# Patient Record
Sex: Female | Born: 1990 | Race: White | Hispanic: No | Smoking: Never smoker
Health system: Southern US, Community
[De-identification: ages and names within clinical notes are randomized; demographics above are authoritative.]

---

## 2018-07-15 ENCOUNTER — Other Ambulatory Visit: Payer: Self-pay

## 2018-07-15 ENCOUNTER — Ambulatory Visit (HOSPITAL_COMMUNITY)
Admission: EM | Admit: 2018-07-15 | Discharge: 2018-07-15 | Disposition: A | Discharge status: Home / Self Care | Payer: Medicaid Other | Attending: Family Medicine | Admitting: Family Medicine

## 2018-07-15 ENCOUNTER — Encounter (HOSPITAL_COMMUNITY): Payer: Self-pay

## 2018-07-15 DIAGNOSIS — Z3202 Encounter for pregnancy test, result negative: Secondary | ICD-10-CM | POA: Diagnosis not present

## 2018-07-15 DIAGNOSIS — R1032 Left lower quadrant pain: Secondary | ICD-10-CM

## 2018-07-15 DIAGNOSIS — N739 Female pelvic inflammatory disease, unspecified: Secondary | ICD-10-CM

## 2018-07-15 LAB — POCT URINALYSIS DIP (DEVICE)
BILIRUBIN URINE: NEGATIVE
Glucose, UA: NEGATIVE mg/dL
Hgb urine dipstick: NEGATIVE
KETONES UR: NEGATIVE mg/dL
Leukocytes, UA: NEGATIVE
Nitrite: NEGATIVE
PH: 7 (ref 5.0–8.0)
Protein, ur: NEGATIVE mg/dL
SPECIFIC GRAVITY, URINE: 1.02 (ref 1.005–1.030)
Urobilinogen, UA: 0.2 mg/dL (ref 0.0–1.0)

## 2018-07-15 LAB — POCT PREGNANCY, URINE: Preg Test, Ur: NEGATIVE

## 2018-07-15 MED ORDER — DOXYCYCLINE HYCLATE 100 MG PO CAPS: 100.0000 mg | ORAL_CAPSULE | Freq: Two times a day (BID) | ORAL | 0 refills | Status: AC

## 2018-07-15 MED ORDER — METRONIDAZOLE 500 MG PO TABS: 500.0000 mg | ORAL_TABLET | Freq: Two times a day (BID) | ORAL | 0 refills | Status: AC

## 2018-07-15 NOTE — Discharge Instructions (Addendum)
Received ceftriaxone shot at ED 4 days ago Urine did not show signs of infection Urine pregnancy was negative Cervical swab obtained Prescribed metronidazole 500 mg twice daily for 14 days (do not take while consuming alcohol) Prescribed doxycycline 100 mg twice daily for 14 days Take medications as prescribed and to completion We will follow up with you regarding the results of your test If tests are positive, please abstain from sexual activity for at least 7 days and notify partners If symptoms improve and you want to follow up with a GYN regarding possible ovarian cysts please [facility] with [facility] Return here or [facility] if you have any new or worsening symptoms such as worsening pain, vaginal bleeding, no improvement in symptoms within 24 hours, vaginal pain, nausea, vomiting, etc...Marland Kitchen

## 2018-07-15 NOTE — ED Provider Notes (Signed)
[facility]   [id number] 07/15/18 Arrival Time: 1054  CC: ABDOMINAL DISCOMFORT  SUBJECTIVE:  Terriann Difonzo is a 27 y.o. female who presents with complaint of worsening abdominal discomfort that began gradually 2 days.  Denies a precipitating event, or trauma.  Localizes pain to LLQ.  Describes as constant and sharp in character.  Pain is 9/10.  Has not tried OTC medications.  Worse with intercourse.  Reports similar symptoms in the past and diagnosed with cyst on ovary.  Last BM this morning and with straining.  Increased appetite, and constipation.    Denies fever, chills, weight changes, nausea, vomiting, chest pain, SOB, diarrhea,, hematochezia, melena, dysuria, difficulty urinating, increased frequency or urgency, flank pain, loss of bowel or bladder function, vaginal discharge, vaginal odor, vaginal bleeding, vaginal rashes or lesions.    Patient was seen in the ED 4 days ago and treated for acid reflux.  Patient also received STD treatment at that time including a shot and four pills.  States urine did not show signs of infection at that time.  Unsure if urine pregnancy was done at that time.    Patient's last menstrual period was 07/01/2018. Last unprotected sex over 1 month ago.  Currently not on birth control.    ROS: As per HPI.  History reviewed. No pertinent past medical history. History reviewed. No pertinent surgical history. Not on File No current facility-administered medications on file prior to encounter.    No current outpatient medications on file prior to encounter.   Social History   Socioeconomic History  . Marital status: Divorced    Spouse name: Not on file  . Number of children: Not on file  . Years of education: Not on file  . Highest education level: Not on file  Occupational History  . Not on file  Social Needs  . Financial resource strain: Not on file  . Food insecurity:    Worry: Not on file    Inability: Not on file  .  Transportation needs:    Medical: Not on file    Non-medical: Not on file  Tobacco Use  . Smoking status: Never Smoker  . Smokeless tobacco: Never Used  Substance and Sexual Activity  . Alcohol use: Not on file  . Drug use: Not on file  . Sexual activity: Not on file  Lifestyle  . Physical activity:    Days per week: Not on file    Minutes per session: Not on file  . Stress: Not on file  Relationships  . Social connections:    Talks on phone: Not on file    Gets together: Not on file    Attends religious service: Not on file    Active member of club or organization: Not on file    Attends meetings of clubs or organizations: Not on file    Relationship status: Not on file  . Intimate partner violence:    Fear of current or ex partner: Not on file    Emotionally abused: Not on file    Physically abused: Not on file    Forced sexual activity: Not on file  Other Topics Concern  . Not on file  Social History Narrative  . Not on file   History reviewed. No pertinent family history.   OBJECTIVE:  Vitals:   07/15/18 1135  BP: 121/84  Pulse: 73  Resp: 18  Temp: 98.3 F (36.8 C)  TempSrc: Oral  SpO2: 100%  Weight: 170 lb (77.1 kg)  General appearance: AOx3 in no acute distress; appears uncomfortable, but nontoxic HEENT: NCAT.  Oropharynx clear.  Lungs: clear to auscultation bilaterally without adventitious breath sounds Heart: regular rate and rhythm.  Radial pulses 2+ symmetrical bilaterally Abdomen: soft, non-distended; normal active bowel sounds; LLQ tenderness; nontender at McBurney's point; negative Murphy's sign; negative rebound; no guarding GU: Declines chaperone: On external examination no obvious lesions, discharge, or masses Bimanual exam performed prior to speculum exam.  Mild cervical motion and left-sided adenexal tenderness Speculum exam: Thick white/yellow discharge appreciated during pelvic exam from cervix.  Cervix visualized without erythema,  cervical swab obtained Back: no CVA tenderness Extremities: no edema; symmetrical with no gross deformities Skin: warm and dry Neurologic: normal gait Psychological: alert and cooperative; normal mood and affect  Labs: Results for orders placed or performed during the hospital encounter of 07/15/18 (from the past 24 hour(s))  POCT urinalysis dip (device)     Status: None   Collection Time: 07/15/18 12:19 PM  Result Value Ref Range   Glucose, UA NEGATIVE NEGATIVE mg/dL   Bilirubin Urine NEGATIVE NEGATIVE   Ketones, ur NEGATIVE NEGATIVE mg/dL   Specific Gravity, Urine 1.020 1.005 - 1.030   Hgb urine dipstick NEGATIVE NEGATIVE   pH 7.0 5.0 - 8.0   Protein, ur NEGATIVE NEGATIVE mg/dL   Urobilinogen, UA 0.2 0.0 - 1.0 mg/dL   Nitrite NEGATIVE NEGATIVE   Leukocytes, UA NEGATIVE NEGATIVE  Pregnancy, urine POC     Status: None   Collection Time: 07/15/18 12:22 PM  Result Value Ref Range   Preg Test, Ur NEGATIVE NEGATIVE    ASSESSMENT & PLAN:  1. Female pelvic inflammatory disease     Meds ordered this encounter  Medications  . doxycycline (VIBRAMYCIN) 100 MG capsule    Sig: Take 1 capsule (100 mg total) by mouth 2 (two) times daily for 14 days.    Dispense:  28 capsule    Refill:  0    Order Specific Question:   Supervising Provider    Answer:   Isa RankinMURRAY, LAURA WILSON [[id number]]  . metroNIDAZOLE (FLAGYL) 500 MG tablet    Sig: Take 1 tablet (500 mg total) by mouth 2 (two) times daily for 14 days.    Dispense:  28 tablet    Refill:  0    Order Specific Question:   Supervising Provider    Answer:   Isa RankinMURRAY, LAURA WILSON [[id number]]   Received ceftriaxone shot at ED 4 days ago Urine did not show signs of infection Urine pregnancy was negative Cervical swab obtained Prescribed metronidazole 500 mg twice daily for 14 days (do not take while consuming alcohol) Prescribed doxycycline 100 mg twice daily for 14 days Take medications as prescribed and to completion We will follow up with  you regarding the results of your test If tests are positive, please abstain from sexual activity for at least 7 days and notify partners If symptoms improve and you want to follow up with a GYN regarding possible ovarian cysts please follow [facility] with [facility] Return here or [facility] if you have any new or worsening symptoms such as worsening pain, vaginal bleeding, no improvement in symptoms within 24 hours, vaginal pain, nausea, vomiting, etc...  Reviewed expectations re: course of current medical issues. Questions answered. Outlined signs and symptoms indicating need for more acute intervention. Patient verbalized understanding. After Visit Summary given.   Rennis HardingWurst, Uva Runkel, PA-C 07/15/18 1236

## 2018-07-15 NOTE — ED Triage Notes (Signed)
Pt states she has abdominal pain ( lower pelvis left side ) and lower back pain x 3 weeks.

## 2018-07-16 LAB — CERVICOVAGINAL ANCILLARY ONLY
Bacterial vaginitis: POSITIVE — AB
Candida vaginitis: POSITIVE — AB
Chlamydia: NEGATIVE
Neisseria Gonorrhea: NEGATIVE
Trichomonas: NEGATIVE

## 2018-07-17 ENCOUNTER — Emergency Department (HOSPITAL_COMMUNITY)
Admission: EM | Admit: 2018-07-17 | Discharge: 2018-07-17 | Disposition: A | Discharge status: Home / Self Care | Payer: Medicaid Other | Attending: Emergency Medicine | Admitting: Emergency Medicine

## 2018-07-17 ENCOUNTER — Telehealth (HOSPITAL_COMMUNITY): Payer: Self-pay

## 2018-07-17 ENCOUNTER — Encounter (HOSPITAL_COMMUNITY): Payer: Self-pay | Admitting: Emergency Medicine

## 2018-07-17 ENCOUNTER — Other Ambulatory Visit: Payer: Self-pay

## 2018-07-17 DIAGNOSIS — R109 Unspecified abdominal pain: Secondary | ICD-10-CM | POA: Insufficient documentation

## 2018-07-17 DIAGNOSIS — Z5321 Procedure and treatment not carried out due to patient leaving prior to being seen by health care provider: Secondary | ICD-10-CM | POA: Diagnosis not present

## 2018-07-17 DIAGNOSIS — Y939 Activity, unspecified: Secondary | ICD-10-CM | POA: Insufficient documentation

## 2018-07-17 DIAGNOSIS — Y999 Unspecified external cause status: Secondary | ICD-10-CM | POA: Diagnosis not present

## 2018-07-17 DIAGNOSIS — M25511 Pain in right shoulder: Secondary | ICD-10-CM | POA: Diagnosis not present

## 2018-07-17 DIAGNOSIS — M549 Dorsalgia, unspecified: Secondary | ICD-10-CM | POA: Insufficient documentation

## 2018-07-17 DIAGNOSIS — Y92410 Unspecified street and highway as the place of occurrence of the external cause: Secondary | ICD-10-CM | POA: Diagnosis not present

## 2018-07-17 MED ORDER — FLUCONAZOLE 150 MG PO TABS: 150.0000 mg | ORAL_TABLET | Freq: Every day | ORAL | 0 refills | Status: AC
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
## 2018-07-17 NOTE — ED Notes (Signed)
Pt came up to ED ambassador, Benson Norway, and said that she had to go and left after handing over her pt labels.

## 2018-07-17 NOTE — Telephone Encounter (Signed)
Bacterial Vaginosis test is positive.  Prescription for metronidazole was given at the urgent care visit. Pt contacted regarding results.  Candida (yeast) was positive.  Prescription for fluconazole 150mg  po now, repeat dose in 3d if needed, #2 no refills, sent to the pharmacy of record.  Recheck or followup with PCP for further evaluation if symptoms are not improving.   Attempted to reach patient. No answer at this time.

## 2020-10-19 ENCOUNTER — Other Ambulatory Visit: Payer: Self-pay

## 2020-10-19 ENCOUNTER — Inpatient Hospital Stay (HOSPITAL_COMMUNITY)
Admission: AD | Admit: 2020-10-19 | Discharge: 2020-10-19 | Disposition: A | Discharge status: Home / Self Care | Payer: Medicaid Other | Attending: Family Medicine | Admitting: Family Medicine

## 2020-10-19 ENCOUNTER — Encounter (HOSPITAL_COMMUNITY): Payer: Self-pay | Admitting: Family Medicine

## 2020-10-19 ENCOUNTER — Inpatient Hospital Stay (HOSPITAL_COMMUNITY): Payer: Medicaid Other

## 2020-10-19 DIAGNOSIS — Z3A01 Less than 8 weeks gestation of pregnancy: Secondary | ICD-10-CM

## 2020-10-19 DIAGNOSIS — O26891 Other specified pregnancy related conditions, first trimester: Secondary | ICD-10-CM | POA: Diagnosis not present

## 2020-10-19 DIAGNOSIS — R109 Unspecified abdominal pain: Secondary | ICD-10-CM | POA: Diagnosis not present

## 2020-10-19 LAB — URINALYSIS, ROUTINE W REFLEX MICROSCOPIC
Bilirubin Urine: NEGATIVE
Glucose, UA: NEGATIVE mg/dL
Hgb urine dipstick: NEGATIVE
Ketones, ur: NEGATIVE mg/dL
Nitrite: NEGATIVE
Protein, ur: NEGATIVE mg/dL
Specific Gravity, Urine: 1.015 (ref 1.005–1.030)
pH: 5 (ref 5.0–8.0)

## 2020-10-19 LAB — CBC
HCT: 42.3 % (ref 36.0–46.0)
Hemoglobin: 13.9 g/dL (ref 12.0–15.0)
MCH: 29.7 pg (ref 26.0–34.0)
MCHC: 32.9 g/dL (ref 30.0–36.0)
MCV: 90.4 fL (ref 80.0–100.0)
Platelets: 316 10*3/uL (ref 150–400)
RBC: 4.68 MIL/uL (ref 3.87–5.11)
RDW: 13.2 % (ref 11.5–15.5)
WBC: 14.5 10*3/uL — ABNORMAL HIGH (ref 4.0–10.5)
nRBC: 0 % (ref 0.0–0.2)

## 2020-10-19 LAB — HCG, QUANTITATIVE, PREGNANCY: hCG, Beta Chain, Quant, S: 19032 m[IU]/mL — ABNORMAL HIGH (ref <5)

## 2020-10-19 LAB — ABO/RH: ABO/RH(D): A POS

## 2020-10-19 LAB — POCT PREGNANCY, URINE: Preg Test, Ur: POSITIVE — AB

## 2020-10-19 NOTE — MAU Note (Signed)
Rolitta Dawson CNM in Family Rm with pt to discuss test results and d/c plan.

## 2020-10-19 NOTE — MAU Note (Signed)
Having sharp pains in lower abd and lower back, started yesterday.  Found out preg on Christmas, +HPT.  Doesn't think she should be having pain like this - this early. Hx of miscarriage. No bleeding.

## 2020-10-19 NOTE — MAU Provider Note (Signed)
History     CSN: [id number]  Arrival date and time: 10/19/20 1421  Provider in family room to explain results and discuss plan of care at 2000    Chief Complaint  Patient presents with  . Abdominal Pain  . Possible Pregnancy   Ms. Reyes Fifield is a 29 y.o. year old G1P0 female at [redacted]w[redacted]d weeks gestation who presents to MAU reporting sharp abdominal pain, (+) HPT on 10/15/2020, no VB, h/o SAB, and she "doesn't think (she) should be having pain like this this early in pregnancy." She has an OB in [place], [place]; first appointment is scheduled for 11/15/2019.   OB History    Gravida  1   Para      Term      Preterm      AB      Living        SAB      IAB      Ectopic      Multiple      Live Births              History reviewed. No pertinent past medical history.  History reviewed. No pertinent surgical history.  History reviewed. No pertinent family history.  Social History   Tobacco Use  . Smoking status: Never Smoker  . Smokeless tobacco: Never Used    Allergies: Not on File  No medications prior to admission.    Review of Systems  Constitutional: Negative.   HENT: Negative.   Eyes: Negative.   Respiratory: Negative.   Cardiovascular: Negative.   Gastrointestinal: Negative.   Endocrine: Negative.   Genitourinary: Positive for pelvic pain.  Musculoskeletal: Negative.   Skin: Negative.   Allergic/Immunologic: Negative.   Neurological: Negative.   Hematological: Negative.   Psychiatric/Behavioral: Negative.    Physical Exam   Blood pressure 112/65, pulse 74, temperature 98.6 F (37 C), temperature source Oral, resp. rate 18, height 5\' 5"  (1.651 m), weight 83.9 kg, last menstrual period 09/19/2020, SpO2 100 %.  Physical Exam Vitals and nursing note reviewed.  Constitutional:      Appearance: She is well-developed and normal weight.  HENT:     Head: Normocephalic and atraumatic.  Cardiovascular:     Rate and Rhythm: Normal rate.   Skin:    General: Skin is warm and dry.  Neurological:     Mental Status: She is alert.     MAU Course  Procedures  MDM CCUA UPT CBC ABO/Rh HCG OB <14 wks U/S  Results for orders placed or performed during the hospital encounter of 10/19/20 (from the past 24 hour(s))  Urinalysis, Routine w reflex microscopic     Status: Abnormal   Collection Time: 10/19/20  3:34 PM  Result Value Ref Range   Color, Urine YELLOW YELLOW   APPearance HAZY (A) CLEAR   Specific Gravity, Urine 1.015 1.005 - 1.030   pH 5.0 5.0 - 8.0   Glucose, UA NEGATIVE NEGATIVE mg/dL   Hgb urine dipstick NEGATIVE NEGATIVE   Bilirubin Urine NEGATIVE NEGATIVE   Ketones, ur NEGATIVE NEGATIVE mg/dL   Protein, ur NEGATIVE NEGATIVE mg/dL   Nitrite NEGATIVE NEGATIVE   Leukocytes,Ua MODERATE (A) NEGATIVE   RBC / HPF 0-5 0 - 5 RBC/hpf   WBC, UA 0-5 0 - 5 WBC/hpf   Bacteria, UA RARE (A) NONE SEEN   Squamous Epithelial / LPF 11-20 0 - 5   Mucus PRESENT   Pregnancy, urine POC     Status: Abnormal   Collection  Time: 10/19/20  3:38 PM  Result Value Ref Range   Preg Test, Ur POSITIVE (A) NEGATIVE  CBC     Status: Abnormal   Collection Time: 10/19/20  4:26 PM  Result Value Ref Range   WBC 14.5 (H) 4.0 - 10.5 K/uL   RBC 4.68 3.87 - 5.11 MIL/uL   Hemoglobin 13.9 12.0 - 15.0 g/dL   HCT 62.9 52.8 - 41.3 %   MCV 90.4 80.0 - 100.0 fL   MCH 29.7 26.0 - 34.0 pg   MCHC 32.9 30.0 - 36.0 g/dL   RDW 24.4 01.0 - 27.2 %   Platelets 316 150 - 400 K/uL   nRBC 0.0 0.0 - 0.2 %  hCG, quantitative, pregnancy     Status: Abnormal   Collection Time: 10/19/20  4:26 PM  Result Value Ref Range   hCG, Beta Chain, Quant, S 19,032 (H) <5 mIU/mL  ABO/Rh     Status: None   Collection Time: 10/19/20  4:26 PM  Result Value Ref Range   ABO/RH(D) A POS    No rh immune globuloin      NOT A RH IMMUNE GLOBULIN CANDIDATE, PT RH POSITIVE Performed at [facility], [address], [place], [place] [address]     Assessment and Plan   Abdominal pain affecting pregnancy  - Reassurance given that pregnancy is 5.[redacted] wks gestation with (+) FHR, EDC: 06/16/2021 - Information provided on abd pain in preg - Discharge home - Keep scheduled appt with  OB on 11/14/2020 - Patient verbalized an understanding of the plan of care and agrees.   Raelyn Mora, CNM 10/19/2020, 8:18 PM

## 2021-07-05 IMAGING — US US OB < 14 WEEKS - US OB TV
1 series · 15 of 28 positions shown · non-contrast
Comparison: None.

CLINICAL DATA: Intrauterine pregnancy with low pelvic pain and back
pain for 1 day. Previous history of miscarriage. Estimated
gestational age by LMP is 4 weeks 2 days. Quantitative beta HCG is
pending. Positive urine pregnancy test.

EXAM:
OBSTETRIC <14 WK ULTRASOUND
TECHNIQUE: Transabdominal ultrasound was performed for evaluation of the
gestation as well as the maternal uterus and adnexal regions.

[Series 1: us ob < 14 weeks - us ob tv · 15 of 86 slices shown]
[im 1/86]
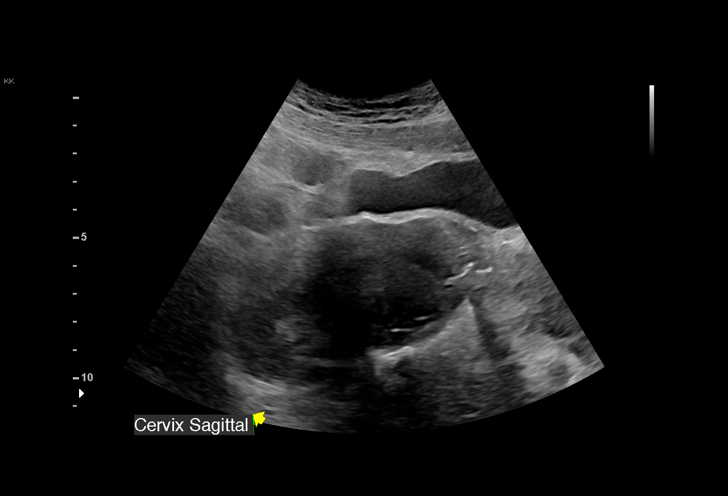
[im 7/86]
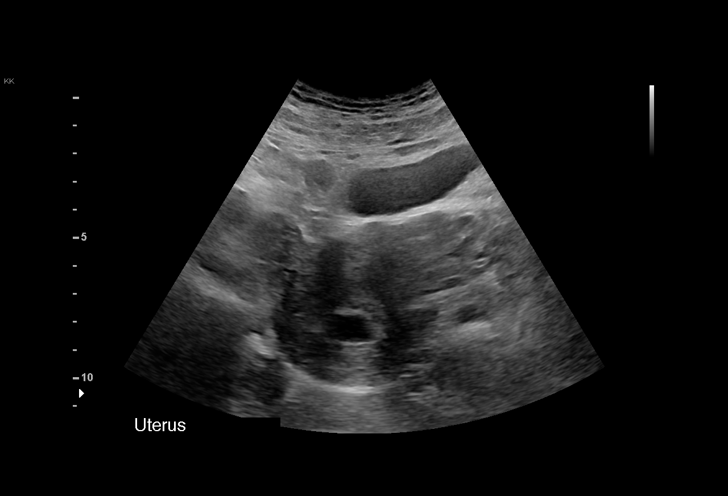
[im 13/86]
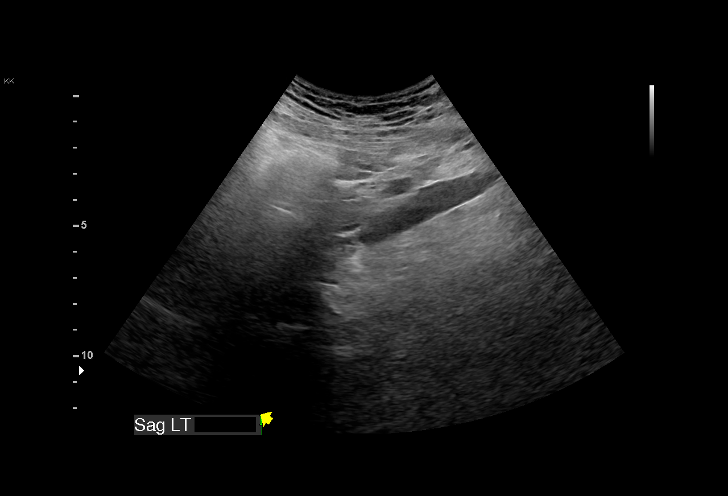
[im 19/86]
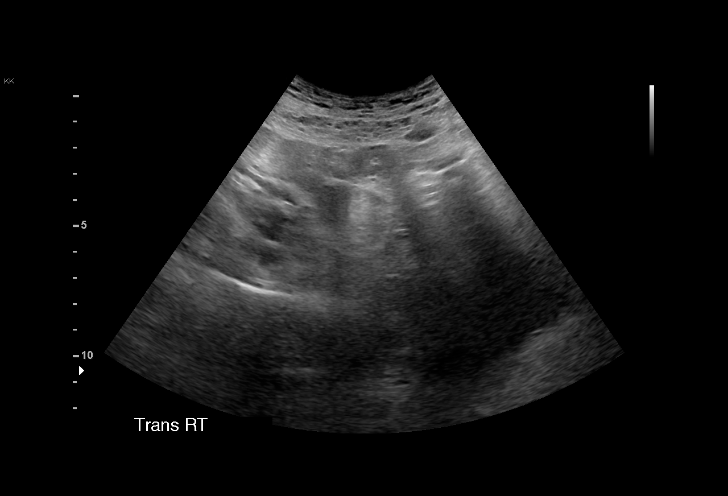
[im 26/86]
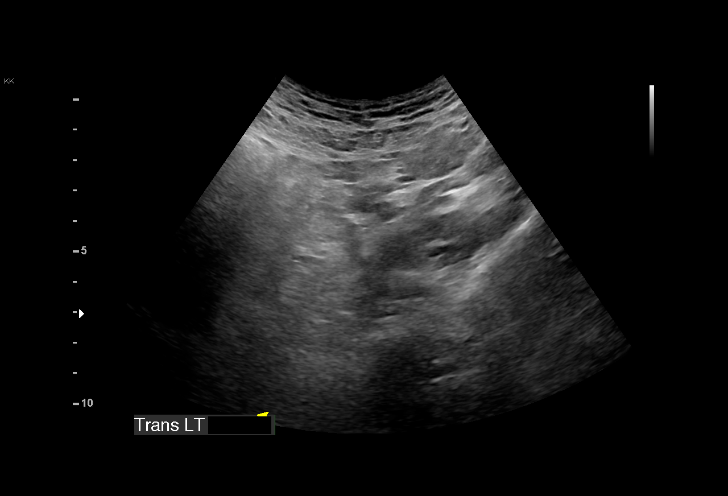
[im 32/86]
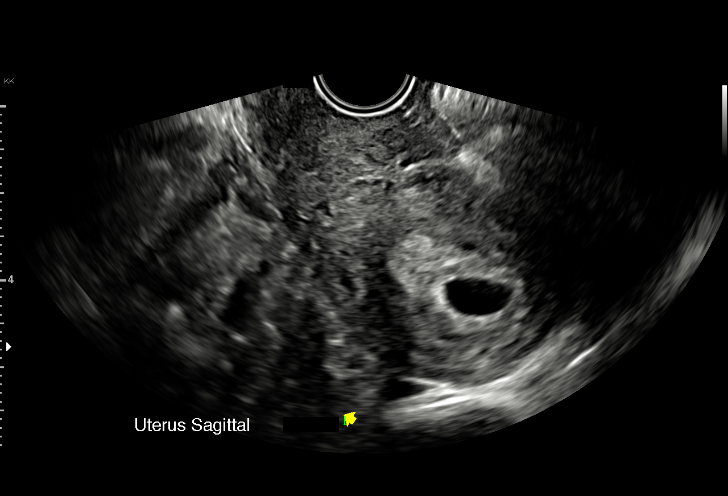
[im 38/86]
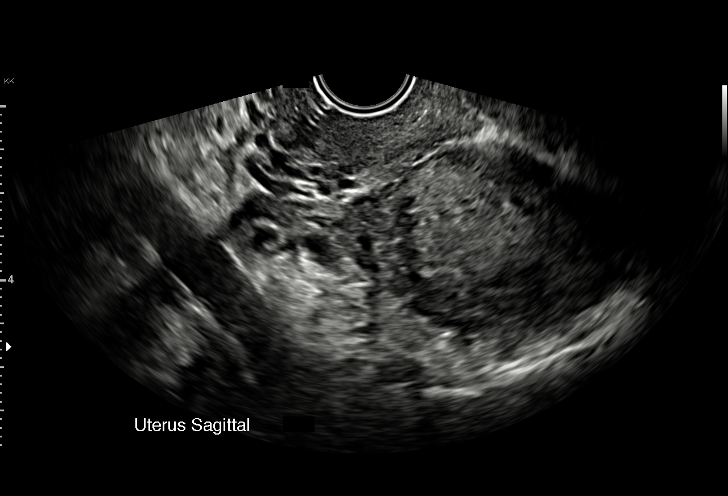
[im 45/86]
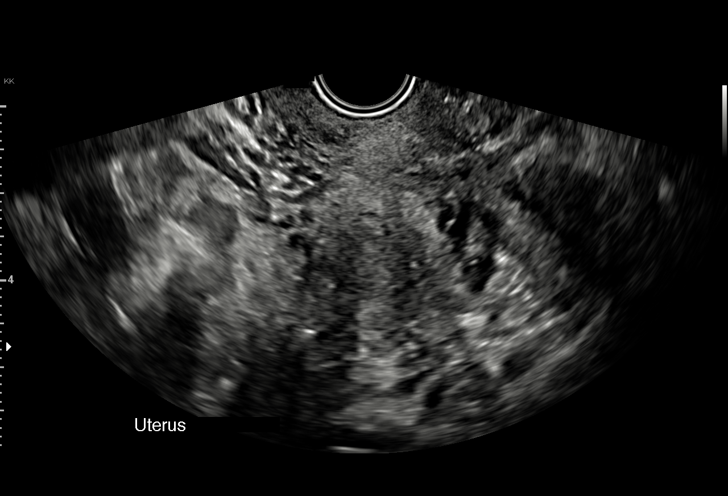
[im 48/86]
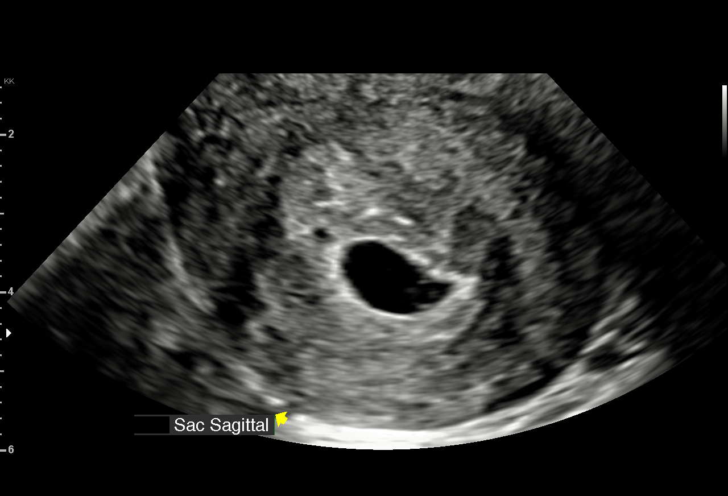
[im 54/86]
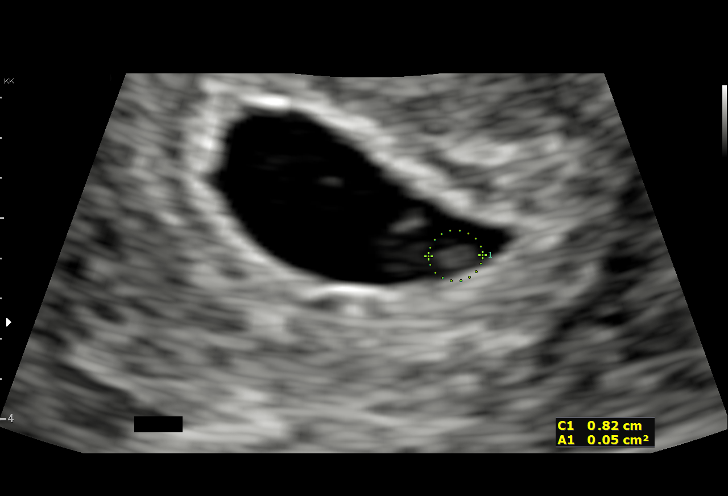
[im 60/86]
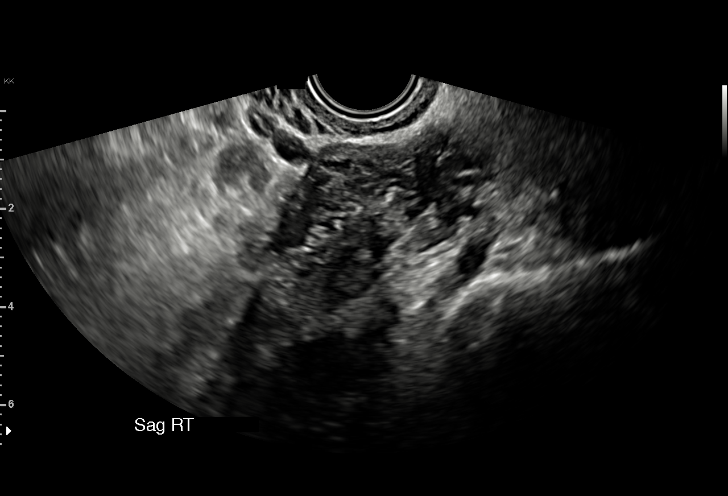
[im 67/86]
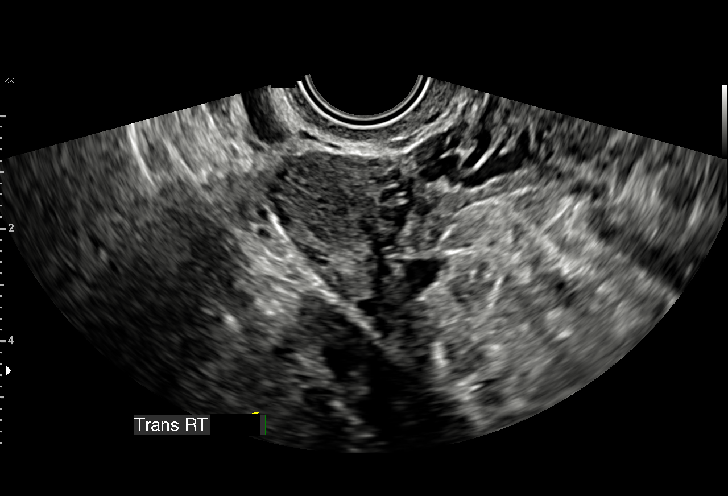
[im 73/86]
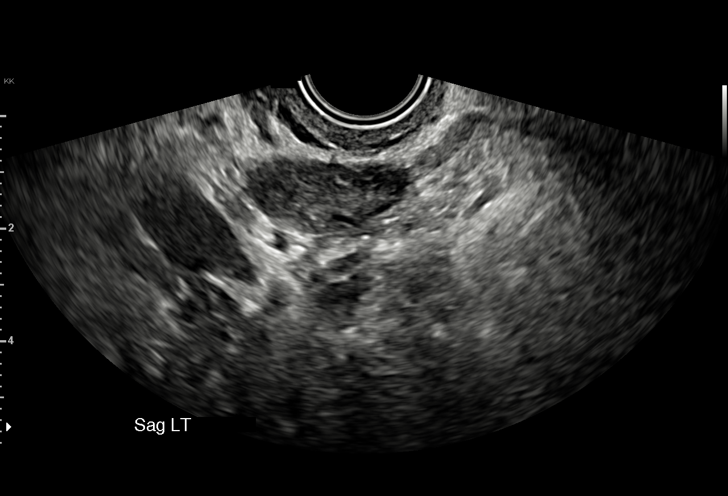
[im 79/86]
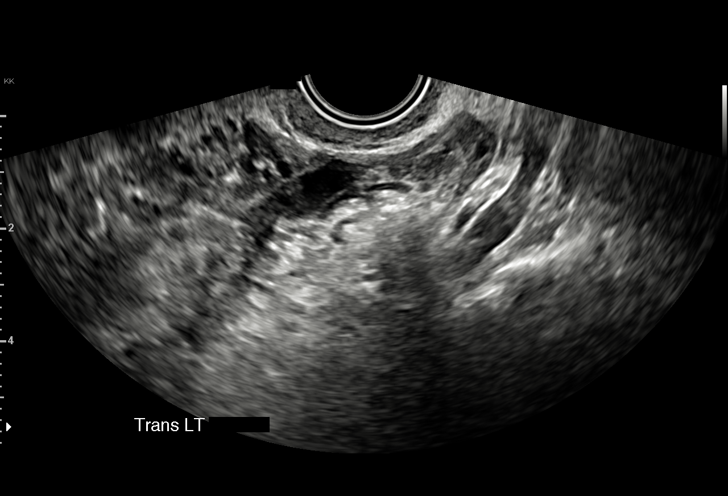
[im 86/86]
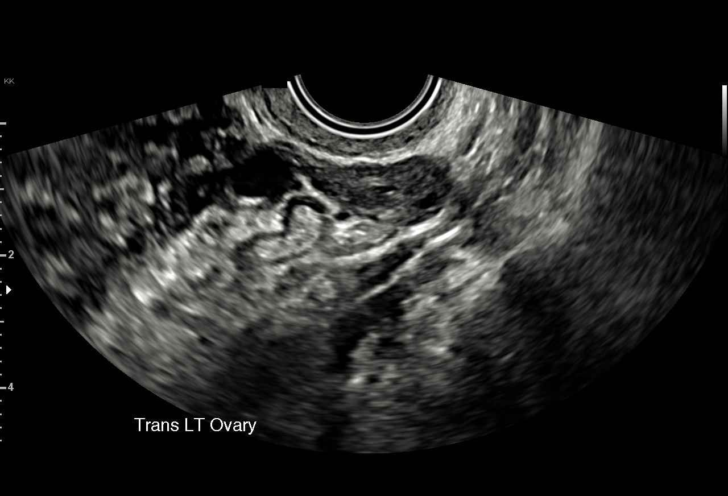

[15 of 28 positions shown; findings below may reference images not displayed]

FINDINGS: Intrauterine gestational sac: A single intrauterine gestational sac
is identified.

Yolk sac:  The yolk sac is visualized.

Embryo:  The fetal pole is present.

Cardiac Activity: Fetal cardiac activity is identified.

Heart Rate: 101 bpm

CRL: 2.1 mm   5 w 5 d                  US EDC: 06/16/2021

Subchorionic hemorrhage:  None visualized.

Maternal uterus/adnexae: The uterus is retroverted. No myometrial
mass lesions are identified. Both ovaries are visualized and appear
normal with corpus luteal cyst on the right. Flow is demonstrated
within both ovaries on color flow Doppler imaging. No free fluid is
identified in the pelvis.
IMPRESSION: Single intrauterine pregnancy. Estimated gestational age by
crown-rump length is 5 weeks 5 days. No acute complication is
identified sonographically.
